# Patient Record
Sex: Female | Born: 2000 | Hispanic: Yes | Marital: Single | Smoking: Never smoker
Health system: Southern US, Community
[De-identification: ages and names within clinical notes are randomized; demographics above are authoritative.]

## PROBLEM LIST (undated history)

## (undated) HISTORY — PX: APPENDECTOMY: SHX54

---

## 2021-07-12 ENCOUNTER — Encounter: Payer: Self-pay | Admitting: Intensive Care

## 2021-07-12 ENCOUNTER — Emergency Department: Payer: Managed Care, Other (non HMO)

## 2021-07-12 ENCOUNTER — Other Ambulatory Visit: Payer: Self-pay

## 2021-07-12 ENCOUNTER — Emergency Department
Admission: EM | Admit: 2021-07-12 | Discharge: 2021-07-13 | Disposition: A | Discharge status: Home / Self Care | Payer: Managed Care, Other (non HMO) | Attending: Emergency Medicine | Admitting: Emergency Medicine

## 2021-07-12 DIAGNOSIS — H538 Other visual disturbances: Secondary | ICD-10-CM | POA: Diagnosis not present

## 2021-07-12 DIAGNOSIS — O99891 Other specified diseases and conditions complicating pregnancy: Secondary | ICD-10-CM | POA: Diagnosis present

## 2021-07-12 DIAGNOSIS — Z3A08 8 weeks gestation of pregnancy: Secondary | ICD-10-CM | POA: Diagnosis not present

## 2021-07-12 LAB — CBC WITH DIFFERENTIAL/PLATELET
Abs Immature Granulocytes: 0.05 10*3/uL (ref 0.00–0.07)
Basophils Absolute: 0 10*3/uL (ref 0.0–0.1)
Basophils Relative: 0 %
Eosinophils Absolute: 0.1 10*3/uL (ref 0.0–0.5)
Eosinophils Relative: 1 %
HCT: 36.7 % (ref 36.0–46.0)
Hemoglobin: 12.4 g/dL (ref 12.0–15.0)
Immature Granulocytes: 0 %
Lymphocytes Relative: 18 %
Lymphs Abs: 2.3 10*3/uL (ref 0.7–4.0)
MCH: 30.6 pg (ref 26.0–34.0)
MCHC: 33.8 g/dL (ref 30.0–36.0)
MCV: 90.6 fL (ref 80.0–100.0)
Monocytes Absolute: 0.7 10*3/uL (ref 0.1–1.0)
Monocytes Relative: 6 %
Neutro Abs: 9.5 10*3/uL — ABNORMAL HIGH (ref 1.7–7.7)
Neutrophils Relative %: 75 %
Platelets: 340 10*3/uL (ref 150–400)
RBC: 4.05 MIL/uL (ref 3.87–5.11)
RDW: 13 % (ref 11.5–15.5)
WBC: 12.7 10*3/uL — ABNORMAL HIGH (ref 4.0–10.5)
nRBC: 0 % (ref 0.0–0.2)

## 2021-07-12 LAB — COMPREHENSIVE METABOLIC PANEL
ALT: 25 U/L (ref 0–44)
AST: 22 U/L (ref 15–41)
Albumin: 4.6 g/dL (ref 3.5–5.0)
Alkaline Phosphatase: 49 U/L (ref 38–126)
Anion gap: 7 (ref 5–15)
BUN: 10 mg/dL (ref 6–20)
CO2: 24 mmol/L (ref 22–32)
Calcium: 9.5 mg/dL (ref 8.9–10.3)
Chloride: 106 mmol/L (ref 98–111)
Creatinine, Ser: 0.6 mg/dL (ref 0.44–1.00)
GFR, Estimated: >60 mL/min (ref >60)
Glucose, Bld: 97 mg/dL (ref 70–99)
Potassium: 3.8 mmol/L (ref 3.5–5.1)
Sodium: 137 mmol/L (ref 135–145)
Total Bilirubin: 0.6 mg/dL (ref 0.3–1.2)
Total Protein: 7.6 g/dL (ref 6.5–8.1)

## 2021-07-12 NOTE — Discharge Instructions (Addendum)
Your workup in the Emergency Department today was reassuring.  We did not find any specific abnormalities.  We recommend you drink plenty of fluids, take your regular medications and/or any new ones prescribed today, and follow up with the doctor(s) listed in these documents as recommended, specifically your eye doctor and OB provider.  Try to see your eye doctor at the next available opportunity.  You may want to let both doctors know that you had an MRI of your brain that was normal and lab work that looked good as well.  Return to the Emergency Department if you develop new or worsening symptoms that concern you.

## 2021-07-12 NOTE — ED Provider Notes (Signed)
[facility] Emergency Department Provider Note  ____________________________________________   Event Date/Time   First MD Initiated Contact with Patient 07/12/21 2314     (approximate)  I have reviewed the triage vital signs and the nursing notes.   HISTORY  Chief Complaint Blurred Vision    HPI Sally Armstrong is a 20 y.o. female with no chronic medical issues and who is about [redacted] weeks pregnant.  She is visiting locally from [place].  She has had some blurry vision in her left eye for 2 days.  No pain.  No headache.  No neck pain.  Onset was relatively sudden and she says that when she looked in the mirror couple days ago it looked like her left pupil was bigger than the right pupil and it did not react light.  She had no trauma.  Her only new recent medication is likely just and she said that she thinks maybe it was the medicine that is making her feel bad.  She was also very fatigued and slept most of the day the day before yesterday.  She has had no fever.  She denies chest pain, shortness of breath, cough, abdominal pain, vaginal bleeding, and dysuria.  She has had some nausea and vomiting throughout her pregnancy so far, hence the Diclegis.  She has had no numbness nor weakness in any of her extremities.  She has been waiting very long time due to overwhelming ED patient volume and she said that she feels better now.  Nothing in particular made her symptoms better or worse.  She called her doctor about the blurry vision in her left eye and they told her to go to the emergency department because she might be having a stroke.     History reviewed. No pertinent past medical history.  There are no problems to display for this patient.   Past Surgical History:  Procedure Laterality Date   APPENDECTOMY      Prior to Admission medications   Medication Sig Start Date End Date Taking? Authorizing Provider  Doxylamine-Pyridoxine 10-10 MG TBEC Take 1 tablet  by mouth daily.   Yes [provider]    Allergies Patient has no known allergies.  History reviewed. No pertinent family history.  Social History Social History   Tobacco Use   Smoking status: Never   Smokeless tobacco: Never  Substance Use Topics   Alcohol use: Not Currently   Drug use: Not Currently    Review of Systems Constitutional: No fever/chills Eyes: Blurry vision in left eye.  Subjective anisocoria of the left eye according to the patient, within the last couple of days. ENT: No sore throat. Cardiovascular: Denies chest pain. Respiratory: Denies shortness of breath. Gastrointestinal: No abdominal pain.  No nausea, no vomiting.  No diarrhea.  No constipation. Genitourinary: Negative for dysuria.  Negative for vaginal bleeding. Musculoskeletal: Negative for neck pain.  Negative for back pain. Integumentary: Negative for rash. Neurological: Negative for headaches, focal weakness or numbness.   ____________________________________________   PHYSICAL EXAM:  VITAL SIGNS: ED Triage Vitals  Enc Vitals Group     BP 07/12/21 1626 121/81     Pulse Rate 07/12/21 1626 68     Resp 07/12/21 1626 16     Temp 07/12/21 1626 98.9 F (37.2 C)     Temp Source 07/12/21 1626 Oral     SpO2 07/12/21 1626 100 %     Weight 07/12/21 1627 76.2 kg (168 lb)     Height 07/12/21  1627 1.549 m (5\' 1" )     Head Circumference --      Peak Flow --      Pain Score 07/12/21 1627 0     Pain Loc --      Pain Edu? --      Excl. in GC? --     Constitutional: Alert and oriented.  Eyes: Conjunctivae are normal.  Pupils are equal and reactive with normal accommodation, normal extraocular movement. Head: Atraumatic. Nose: No congestion/rhinnorhea. Mouth/Throat: Patient is wearing a mask. Neck: No stridor.  No meningeal signs.   Cardiovascular: Normal rate, regular rhythm. Good peripheral circulation. Respiratory: Normal respiratory effort.  No retractions. Gastrointestinal: Soft  and nontender. No distention.  Musculoskeletal: No lower extremity tenderness nor edema. No gross deformities of extremities. Neurologic:  Normal speech and language. No gross focal neurologic deficits are appreciated.  Good grip strength and major muscle groups strength in upper and lower extremities bilaterally. Skin:  Skin is warm, dry and intact. Psychiatric: Mood and affect are normal. Speech and behavior are normal.  ____________________________________________   LABS (all labs ordered are listed, but only abnormal results are displayed)  Labs Reviewed  CBC WITH DIFFERENTIAL/PLATELET - Abnormal; Notable for the following components:      Result Value   WBC 12.7 (*)    Neutro Abs 9.5 (*)    All other components within normal limits  COMPREHENSIVE METABOLIC PANEL  HCG, QUANTITATIVE, PREGNANCY   ____________________________________________   RADIOLOGY I, 07/14/21, personally viewed and evaluated these images (plain radiographs) as part of my medical decision making, as well as reviewing the written report by the radiologist.  ED MD interpretation: No abnormalities identified on MRI  Official radiology report(s): MR BRAIN WO CONTRAST  Result Date: 07/12/2021 CLINICAL DATA:  Monocular vision loss left eye EXAM: MRI HEAD WITHOUT CONTRAST TECHNIQUE: Multiplanar, multiecho pulse sequences of the brain and surrounding structures were obtained without intravenous contrast. COMPARISON:  None. FINDINGS: Brain: No acute infarction, hemorrhage, hydrocephalus, extra-axial collection or mass lesion. Pituitary not enlarged. Normal white matter. Vascular: Normal arterial flow voids Skull and upper cervical spine: Negative Sinuses/Orbits: Negative Other: None IMPRESSION: Negative MRI head without contrast. Electronically Signed   By: 07/14/2021 M.D.   On: 07/12/2021 19:24    ____________________________________________   INITIAL IMPRESSION / MDM / ASSESSMENT AND PLAN / ED COURSE  As  part of my medical decision making, I reviewed the following data within the electronic MEDICAL RECORD NUMBER Nursing notes reviewed and incorporated, Labs reviewed , Old chart reviewed, and Notes from prior ED visits   Differential diagnosis includes, but is not limited to, medication or drug side effect, trauma, intracranial bleed, venous sinus thrombosis or other "blood clot" in the brain, acute infection, globe injury, acute angle-closure glaucoma, retinal detachment, acute iritis or uveitis.  Patient is having nonpainful blurry vision in the left eye that seems intermittent or transient.  She has a reassuring exam at this time with no anisocoria, normal pupillary response and accommodation, normal MRI.  Vital signs are normal and the patient has no sign of infection.  Metabolic panel is normal and CBC shows only slight leukocytosis which is not unexpected given her pregnancy.  Patient is well appearing and in no distress with minimal if any symptoms at this time.  She feels reassured by her exam and work-up including her MRI.  She will return to 07/14/2021 soon and I encouraged her to follow-up with her regular ophthalmologist as well as her OB provider  and I gave my usual and customary return precautions in the meantime.  She understands and agrees with the plan and is comfortable with the plan for discharge.           ____________________________________________  FINAL CLINICAL IMPRESSION(S) / ED DIAGNOSES  Final diagnoses:  Blurry vision, left eye     MEDICATIONS GIVEN DURING THIS VISIT:  Medications - No data to display   ED Discharge Orders     None        Note:  This document was prepared using Dragon voice recognition software and may include unintentional dictation errors.   Loleta Rose, MD 07/13/21 0005

## 2021-07-12 NOTE — ED Triage Notes (Signed)
Pt arrives POV with CC of blurred vision in L eye. Pt is pregnant and is scheduled for 8 week ultrasound on 07/16/21. Pt reports blurred vision upon waking on 07/10/21 - pt did take diclegis for the first time for nausea at 8pm on 07/09/21 and the following am.

## 2023-02-22 IMAGING — MR MR HEAD W/O CM
11 series · 45 of 48 positions shown · non-contrast
Comparison: None.

CLINICAL DATA: Monocular vision loss left eye

EXAM:
MRI HEAD WITHOUT CONTRAST
TECHNIQUE: Multiplanar, multiecho pulse sequences of the brain and surrounding
structures were obtained without intravenous contrast.

[Series 5: ax dwi_tracew · axial · 3.0mm · 0.65mm/px · z∈[-139,+13]mm · 4 of 48 slices shown]
[im 1/48]
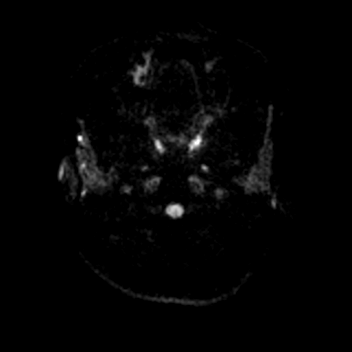
[im 16/48]
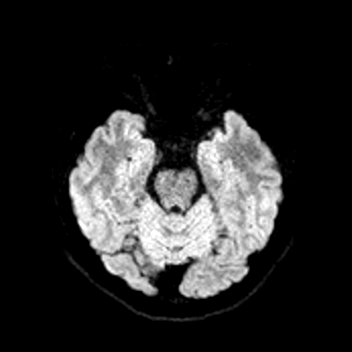
[im 32/48]
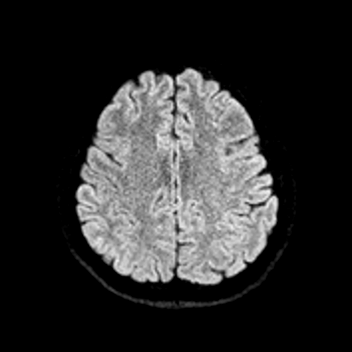
[im 48/48]
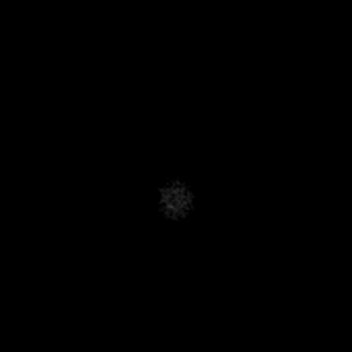

[Series 6: ax dwi_adc · axial · 3.0mm · 0.65mm/px · z∈[-139,+13]mm · 4 of 48 slices shown]
[im 1/48]
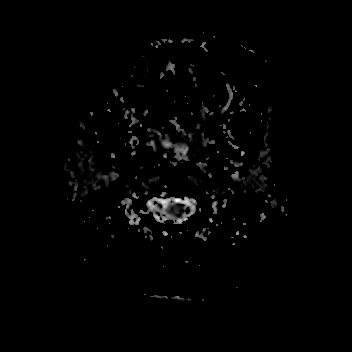
[im 16/48]
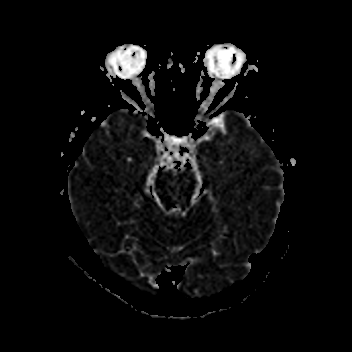
[im 32/48]
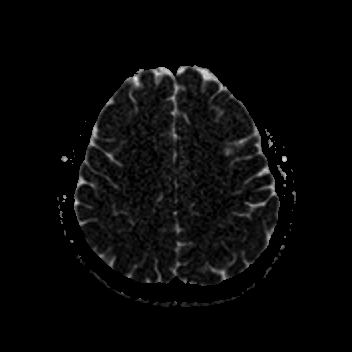
[im 48/48]
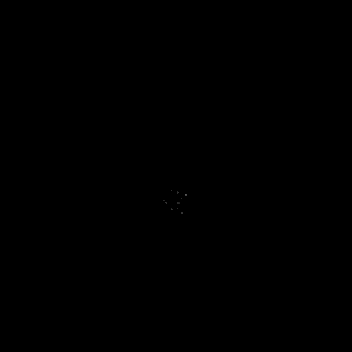

[Series 7: cor dwi_tracew · coronal · 5.0mm · 0.68mm/px · 3 of 36 slices shown]
[im 1/36]
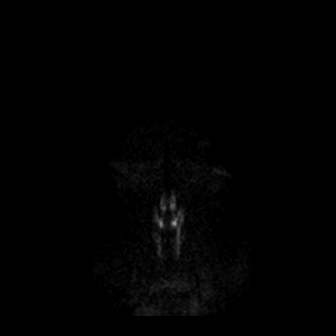
[im 18/36]
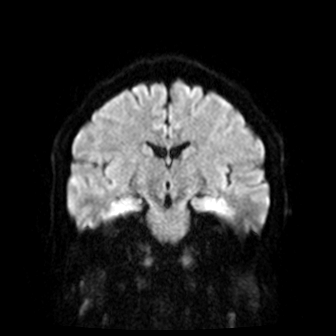
[im 36/36]
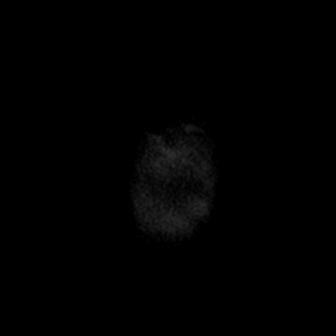

[Series 8: cor dwi_adc · coronal · 5.0mm · 0.68mm/px · 3 of 36 slices shown]
[im 1/36]
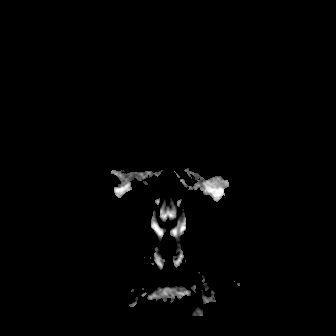
[im 18/36]
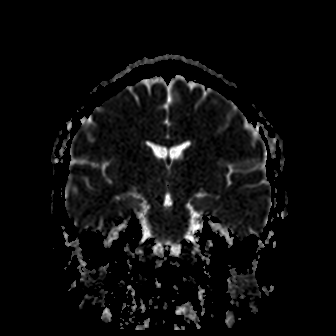
[im 36/36]
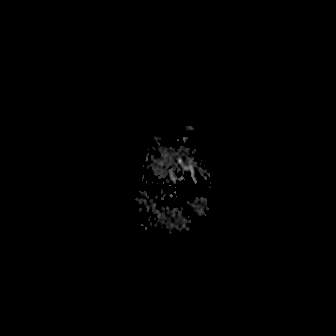

[Series 9: T1 · sagittal · 5.0mm · 0.62mm/px · 2 of 23 slices shown (1 of 2)]
[im 1/23]
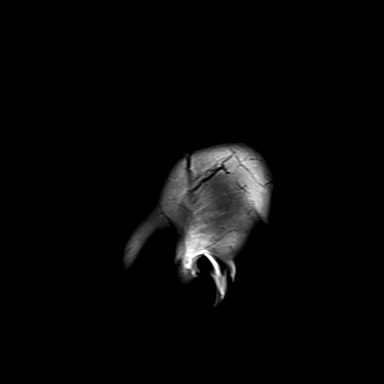
[im 23/23]
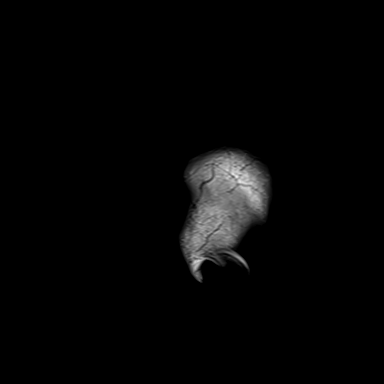

[Series 10: T2 · axial · 5.0mm · 0.45mm/px · z∈[-128,+15]mm · 2 of 25 slices shown (1 of 2)]
[im 1/25]
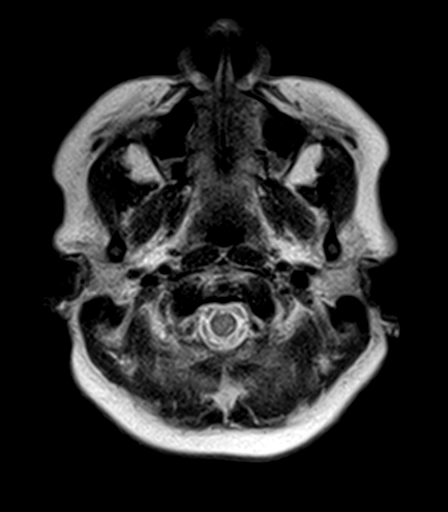
[im 25/25]
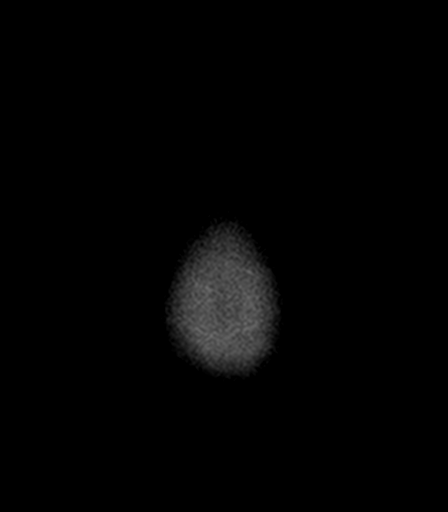

[Series 12: pha_images · axial · 3.0mm · 0.90mm/px · z∈[-132,+16]mm · 5 of 51 slices shown]
[im 1/51]
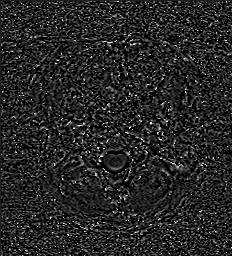
[im 13/51]
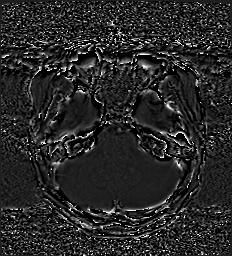
[im 26/51]
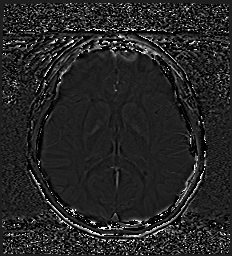
[im 38/51]
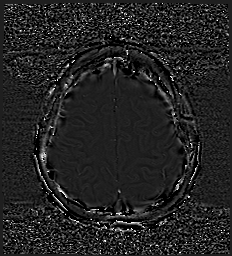
[im 51/51]
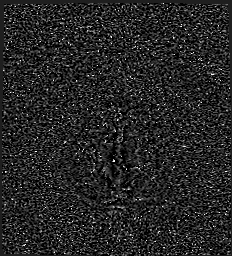

[Series 13: swi_images · axial · 3.0mm · 0.90mm/px · z∈[-132,+19]mm · 5 of 52 slices shown]
[im 1/52]
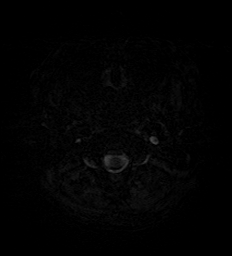
[im 13/52]
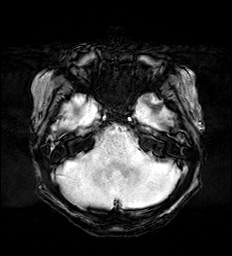
[im 26/52]
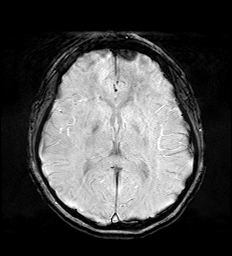
[im 39/52]
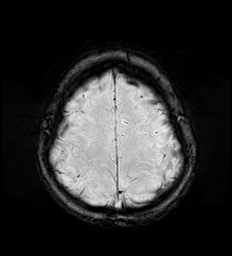
[im 52/52]
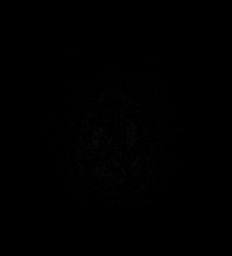

[Series 15: FLAIR · axial · 3.0mm · 0.53mm/px · z∈[-129,+16]mm · 4 of 50 slices shown]
[im 1/50]
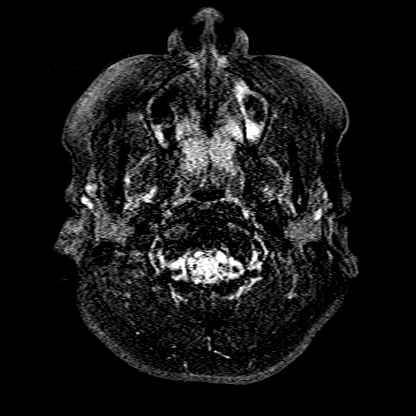
[im 17/50]
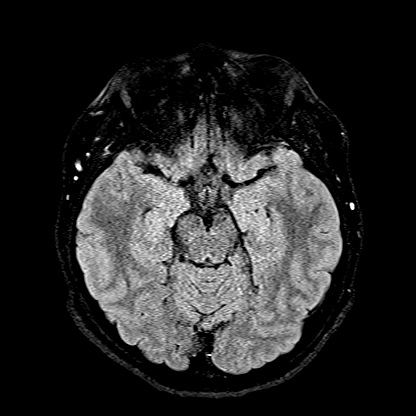
[im 33/50]
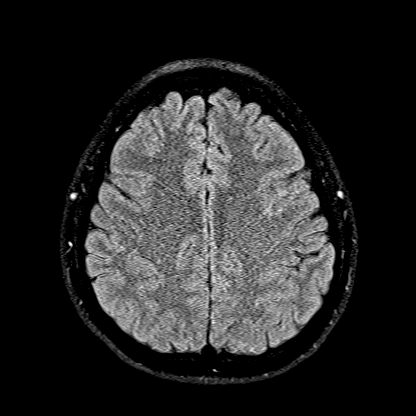
[im 50/50]
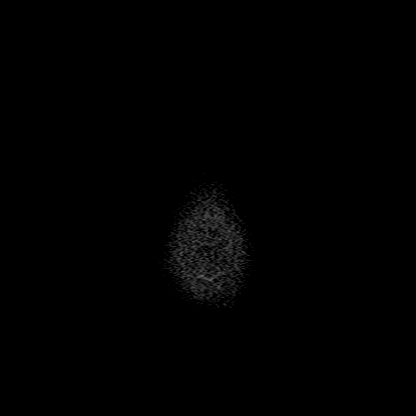

[Series 16: T1 · axial · 1.0mm · 0.98mm/px · z∈[-129,+12]mm · 10 of 144 slices shown (2 of 2)]
[im 1/144]
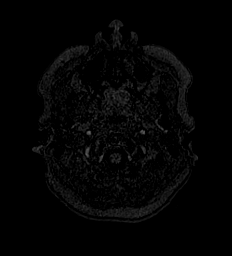
[im 12/144]
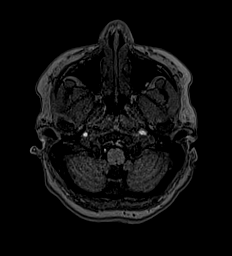
[im 24/144]
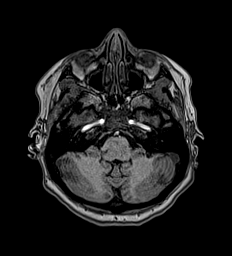
[im 36/144]
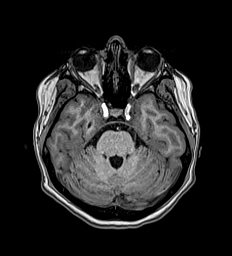
[im 48/144]
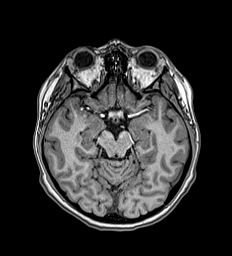
[im 60/144]
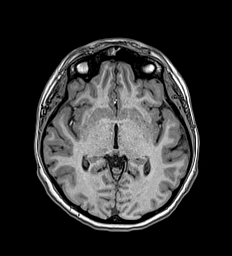
[im 84/144]
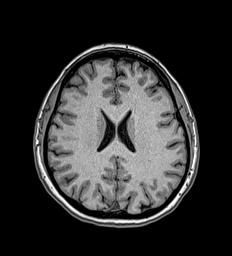
[im 96/144]
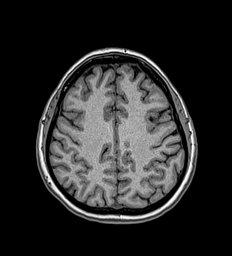
[im 120/144]
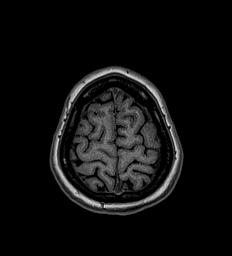
[im 144/144]
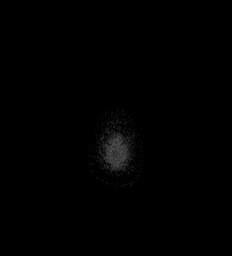

[Series 17: T2 · coronal · 5.0mm · 0.45mm/px · 3 of 30 slices shown (2 of 2)]
[im 1/30]
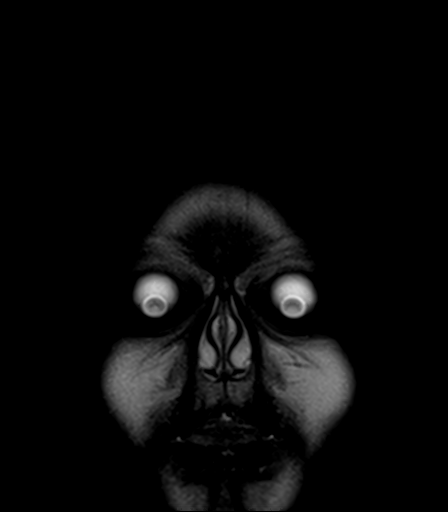
[im 15/30]
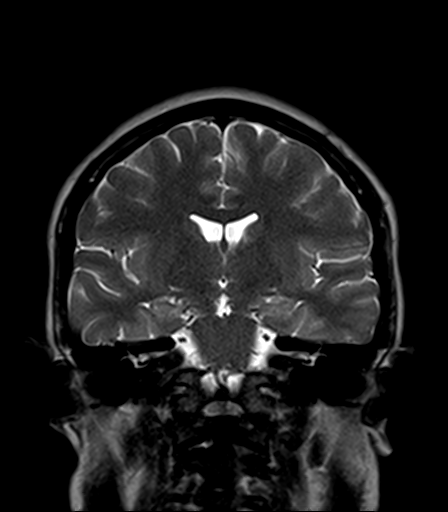
[im 30/30]
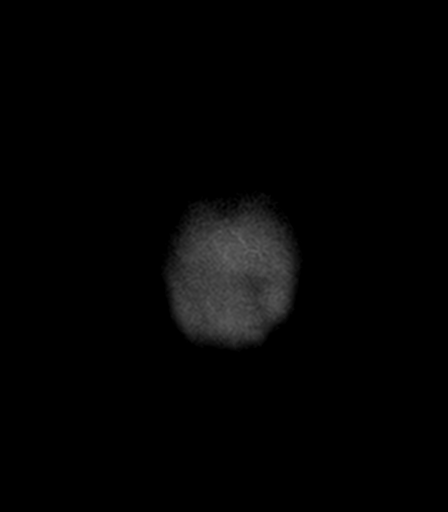

[45 of 48 positions shown; findings below may reference images not displayed]

FINDINGS: Brain: No acute infarction, hemorrhage, hydrocephalus, extra-axial
collection or mass lesion. Pituitary not enlarged. Normal white
matter.

Vascular: Normal arterial flow voids

Skull and upper cervical spine: Negative

Sinuses/Orbits: Negative

Other: None
IMPRESSION: Negative MRI head without contrast.
